# Patient Record
Sex: Female | Born: 1980 | Race: White | Hispanic: No | Marital: Married | Smoking: Former smoker
Health system: Southern US, Community
[De-identification: ages and names within clinical notes are randomized; demographics above are authoritative.]

## PROBLEM LIST (undated history)

## (undated) DIAGNOSIS — Z789 Other specified health status: Secondary | ICD-10-CM

## (undated) HISTORY — PX: NO PAST SURGERIES: SHX2092

---
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
## 2004-03-05 ENCOUNTER — Ambulatory Visit: Payer: Self-pay | Admitting: Pediatrics

## 2004-04-10 ENCOUNTER — Ambulatory Visit: Payer: Self-pay | Admitting: Pediatrics

## 2004-06-08 ENCOUNTER — Ambulatory Visit: Payer: Self-pay | Admitting: Pediatrics

## 2004-11-19 ENCOUNTER — Ambulatory Visit: Payer: Self-pay | Admitting: Pediatrics

## 2005-04-11 ENCOUNTER — Ambulatory Visit: Payer: Self-pay | Admitting: Pediatrics

## 2009-05-09 ENCOUNTER — Ambulatory Visit (HOSPITAL_BASED_OUTPATIENT_CLINIC_OR_DEPARTMENT_OTHER): Admission: RE | Admit: 2009-05-09 | Discharge: 2009-05-09 | Payer: Self-pay | Admitting: Orthopedic Surgery

## 2009-08-13 ENCOUNTER — Inpatient Hospital Stay (HOSPITAL_COMMUNITY): Admission: AD | Admit: 2009-08-13 | Discharge: 2009-08-13 | Payer: Self-pay | Admitting: Obstetrics & Gynecology

## 2010-06-26 LAB — WET PREP, GENITAL
Clue Cells Wet Prep HPF POC: NONE SEEN
Trich, Wet Prep: NONE SEEN
Yeast Wet Prep HPF POC: NONE SEEN

## 2010-06-26 LAB — GC/CHLAMYDIA PROBE AMP, GENITAL: Chlamydia, DNA Probe: NEGATIVE

## 2012-03-10 ENCOUNTER — Other Ambulatory Visit (HOSPITAL_COMMUNITY)
Admission: RE | Admit: 2012-03-10 | Discharge: 2012-03-10 | Disposition: A | Discharge status: Home / Self Care | Payer: 59 | Source: Ambulatory Visit | Attending: Family Medicine | Admitting: Family Medicine

## 2012-03-10 ENCOUNTER — Other Ambulatory Visit: Payer: Self-pay | Admitting: Family Medicine

## 2012-03-10 DIAGNOSIS — Z01419 Encounter for gynecological examination (general) (routine) without abnormal findings: Secondary | ICD-10-CM | POA: Insufficient documentation

## 2018-04-17 ENCOUNTER — Inpatient Hospital Stay (HOSPITAL_COMMUNITY)
Admission: AD | Admit: 2018-04-17 | Discharge: 2018-04-18 | Disposition: A | Discharge status: Home / Self Care | Payer: No Typology Code available for payment source | Source: Ambulatory Visit | Attending: Obstetrics & Gynecology | Admitting: Obstetrics & Gynecology

## 2018-04-17 ENCOUNTER — Encounter (HOSPITAL_COMMUNITY): Payer: Self-pay | Admitting: *Deleted

## 2018-04-17 DIAGNOSIS — O42912 Preterm premature rupture of membranes, unspecified as to length of time between rupture and onset of labor, second trimester: Secondary | ICD-10-CM | POA: Diagnosis not present

## 2018-04-17 DIAGNOSIS — Z3A15 15 weeks gestation of pregnancy: Secondary | ICD-10-CM | POA: Diagnosis not present

## 2018-04-17 DIAGNOSIS — O4290 Premature rupture of membranes, unspecified as to length of time between rupture and onset of labor, unspecified weeks of gestation: Secondary | ICD-10-CM

## 2018-04-17 HISTORY — DX: Other specified health status: Z78.9

## 2018-04-17 NOTE — MAU Note (Addendum)
Had gush fld about 2315 and soaked thru clothes. When we got to hospital had another gush of clear fld. Mild "gas-like pain " earlier. NO BM for couple day. No vag bleeding. Has appt Friday in [place], [place] with CNMs to begin [facility]. First two deliveries svd. Had u/s at [facility] in [place] about 9wks to give edc

## 2018-04-18 ENCOUNTER — Encounter: Payer: Self-pay | Admitting: Student

## 2018-04-18 ENCOUNTER — Inpatient Hospital Stay (HOSPITAL_BASED_OUTPATIENT_CLINIC_OR_DEPARTMENT_OTHER): Payer: No Typology Code available for payment source

## 2018-04-18 ENCOUNTER — Encounter (HOSPITAL_COMMUNITY): Payer: Self-pay

## 2018-04-18 DIAGNOSIS — Z3A15 15 weeks gestation of pregnancy: Secondary | ICD-10-CM | POA: Diagnosis not present

## 2018-04-18 DIAGNOSIS — O42912 Preterm premature rupture of membranes, unspecified as to length of time between rupture and onset of labor, second trimester: Secondary | ICD-10-CM

## 2018-04-18 DIAGNOSIS — O26899 Other specified pregnancy related conditions, unspecified trimester: Secondary | ICD-10-CM

## 2018-04-18 DIAGNOSIS — Z6791 Unspecified blood type, Rh negative: Secondary | ICD-10-CM | POA: Insufficient documentation

## 2018-04-18 DIAGNOSIS — O4292 Full-term premature rupture of membranes, unspecified as to length of time between rupture and onset of labor: Secondary | ICD-10-CM | POA: Diagnosis not present

## 2018-04-18 DIAGNOSIS — O09522 Supervision of elderly multigravida, second trimester: Secondary | ICD-10-CM | POA: Diagnosis not present

## 2018-04-18 DIAGNOSIS — O4290 Premature rupture of membranes, unspecified as to length of time between rupture and onset of labor, unspecified weeks of gestation: Secondary | ICD-10-CM | POA: Insufficient documentation

## 2018-04-18 LAB — URINALYSIS, ROUTINE W REFLEX MICROSCOPIC
BILIRUBIN URINE: NEGATIVE
Bacteria, UA: NONE SEEN
Glucose, UA: NEGATIVE mg/dL
Ketones, ur: NEGATIVE mg/dL
NITRITE: NEGATIVE
PH: 9 — AB (ref 5.0–8.0)
Protein, ur: NEGATIVE mg/dL
SPECIFIC GRAVITY, URINE: 1.008 (ref 1.005–1.030)

## 2018-04-18 LAB — POCT FERN TEST: POCT Fern Test: POSITIVE

## 2018-04-18 LAB — WET PREP, GENITAL
CLUE CELLS WET PREP: NONE SEEN
Sperm: NONE SEEN
Trich, Wet Prep: NONE SEEN
Yeast Wet Prep HPF POC: NONE SEEN

## 2018-04-18 MED ORDER — RHO D IMMUNE GLOBULIN 1500 UNIT/2ML IJ SOSY
300.0000 ug | PREFILLED_SYRINGE | INTRAMUSCULAR | Status: DC
  Filled 2018-04-18: qty 2

## 2018-04-18 NOTE — MAU Note (Signed)
Pt left before getting Rhogam.  Educated her and family about infection precautions related to PPROM and labor precautions. She indicated understanding of when to return to MAU.

## 2018-04-18 NOTE — MAU Provider Note (Addendum)
Patient  Lisa Fowler is a 38 y.o.  G3P2002 at 7993w2d by early US here with complaints of leaking of fluid at 2315 and midnight.  She also had some gas pains but no contractions. She denies complications in this pregnancy. She has had an uncomplicated pregnancy and two prior term NSVD out of hospital with midwives. She denies any health complaints, history of STIs or other ob-gyn problems.    She was planning to start prenatal care with a midwifery group in [place] this week. She does not have a regular ob-gyn in [place]; her other deliveries were with providers who are not local.    History     CSN: [id number]  Arrival date and time: 04/17/18 2333   None     Chief Complaint  Patient presents with  . Rupture of Membranes   Vaginal Discharge  The patient's primary symptoms include vaginal discharge. The patient's pertinent negatives include no genital itching. This is a new problem. The current episode started today. The problem occurs constantly. Associated symptoms include abdominal pain. Pertinent negatives include no dysuria, fever or urgency. The vaginal discharge was clear. There has been no bleeding. She has not been passing clots. She has not been passing tissue. Nothing aggravates the symptoms. She has tried nothing for the symptoms.    OB History    Gravida  3   Para  2   Term  2   Preterm      AB      Living  2     SAB      TAB      Ectopic      Multiple      Live Births              Past Medical History:  Diagnosis Date  . Medical history non-contributory     History reviewed. No pertinent surgical history.  No family history on file.  Social History   Tobacco Use  . Smoking status: Not on file  Substance Use Topics  . Alcohol use: Not on file  . Drug use: Not on file    Allergies: No Known Allergies  No medications prior to admission.    Review of Systems  Constitutional: Negative for fever.  Gastrointestinal: Positive  for abdominal pain.  Genitourinary: Positive for vaginal discharge. Negative for dysuria and urgency.  Neurological: Negative.   Psychiatric/Behavioral: Negative.    Physical Exam   Blood pressure (!) 123/56, pulse (!) 108, temperature 98.1 F (36.7 C), resp. rate 20, height 5\' 3"  (1.6 m), weight 69.9 kg.  Physical Exam  Constitutional: She is oriented to person, place, and time. She appears well-developed and well-nourished.  HENT:  Head: Normocephalic.  Neck: Normal range of motion.  Respiratory: Effort normal.  GI: Soft.  Genitourinary:    Genitourinary Comments: NEFG; on exam there is positive pooling and continous leaking of amniotic fluid mixed with blood. Cervix appears visually closed; digital exam not done.    Musculoskeletal: Normal range of motion.  Neurological: She is alert and oriented to person, place, and time.  Skin: Skin is warm and dry.    MAU Course  Procedures  MDM -patient is grossly ruptured; bedside US shows present cardiac activity and oligohydramnios. GA is 15 weeks 2 days.   -patient is O negative but declines Rhogam at this time.  -Discussed case with Dr. Despina HiddenEure, who recommends that patient be given the option to go home and return when labor starts or if  she develops concerning symptoms or she may be admitted and start cytotec induction.     Assessment and Plan   1. Premature rupture of membranes in second trimester, unspecified duration to onset of labor   2. [redacted] weeks gestation of pregnancy   3. Amniotic fluid leaking    2. Patient elects to go home and rest. She agrees to return to hospital if she develops fever, abdominal pain or tenderness, foul-smelling discharge, bleeding or other concerning symptoms.   3. Reviewed with patient that if she were to suddenly deliver at home she should call 9-11 and that she she must come in for Rhogam if she delivers at home.   4. Counseled her on importance of Rhogam; patient wants to get Rhogam but  declines right now and declines all lab work. She is very tearful and upset; wants to leave with her aunt and husband.    Lisa GaribaldiKathryn Fowler  04/18/2018, 3:43 AM

## 2018-04-18 NOTE — Discharge Instructions (Signed)
-  check temp twice a day; come to MAU if temp is greater than 100.4 -if discharge becomes foul smelling, dark brown/green, come back to MAU -if bleeding or cramping worsen, come to MAU.  -YOU NEED RHOGAM--must return to MAU for Rhogam even if deliver at home.

## 2018-04-20 LAB — GC/CHLAMYDIA PROBE AMP (~~LOC~~) NOT AT ARMC
Chlamydia: NEGATIVE
NEISSERIA GONORRHEA: NEGATIVE

## 2018-04-25 ENCOUNTER — Inpatient Hospital Stay (HOSPITAL_COMMUNITY)
Admission: AD | Admit: 2018-04-25 | Discharge: 2018-04-26 | Disposition: A | Discharge status: Home / Self Care | Payer: Commercial Managed Care - PPO | Source: Ambulatory Visit | Attending: Obstetrics and Gynecology | Admitting: Obstetrics and Gynecology

## 2018-04-25 ENCOUNTER — Inpatient Hospital Stay (HOSPITAL_COMMUNITY): Payer: Commercial Managed Care - PPO

## 2018-04-25 ENCOUNTER — Encounter (HOSPITAL_COMMUNITY): Payer: Self-pay | Admitting: *Deleted

## 2018-04-25 DIAGNOSIS — Z87891 Personal history of nicotine dependence: Secondary | ICD-10-CM | POA: Insufficient documentation

## 2018-04-25 DIAGNOSIS — O26892 Other specified pregnancy related conditions, second trimester: Secondary | ICD-10-CM

## 2018-04-25 DIAGNOSIS — O039 Complete or unspecified spontaneous abortion without complication: Secondary | ICD-10-CM | POA: Insufficient documentation

## 2018-04-25 DIAGNOSIS — Z6791 Unspecified blood type, Rh negative: Secondary | ICD-10-CM | POA: Insufficient documentation

## 2018-04-25 LAB — CBC
HEMATOCRIT: 33.5 % — AB (ref 36.0–46.0)
HEMOGLOBIN: 11.1 g/dL — AB (ref 12.0–15.0)
MCH: 28.5 pg (ref 26.0–34.0)
MCHC: 33.1 g/dL (ref 30.0–36.0)
MCV: 86.1 fL (ref 80.0–100.0)
NRBC: 0 % (ref 0.0–0.2)
PLATELETS: 248 10*3/uL (ref 150–400)
RBC: 3.89 MIL/uL (ref 3.87–5.11)
RDW: 15.7 % — ABNORMAL HIGH (ref 11.5–15.5)
WBC: 16.8 10*3/uL — ABNORMAL HIGH (ref 4.0–10.5)

## 2018-04-25 LAB — TYPE AND SCREEN
ABO/RH(D): O NEG
Antibody Screen: NEGATIVE

## 2018-04-25 MED ORDER — HYDROXYZINE HCL 50 MG PO TABS
50.0000 mg | ORAL_TABLET | Freq: Once | ORAL | Status: AC
  Administered 2018-04-25: 50 mg via ORAL
  Filled 2018-04-25: qty 1

## 2018-04-25 MED ORDER — KETOROLAC TROMETHAMINE 10 MG PO TABS
10.0000 mg | ORAL_TABLET | Freq: Once | ORAL | Status: AC | PRN
  Administered 2018-04-25: 10 mg via ORAL
  Filled 2018-04-25: qty 1

## 2018-04-25 MED ORDER — RHO D IMMUNE GLOBULIN 1500 UNIT/2ML IJ SOSY
300.0000 ug | PREFILLED_SYRINGE | Freq: Once | INTRAMUSCULAR | Status: AC
  Administered 2018-04-25: 300 ug via INTRAMUSCULAR
  Filled 2018-04-25: qty 2

## 2018-04-25 MED ORDER — MISOPROSTOL 200 MCG PO TABS
800.0000 ug | ORAL_TABLET | Freq: Once | ORAL | Status: AC
  Administered 2018-04-25: 800 ug via BUCCAL
  Filled 2018-04-25: qty 4

## 2018-04-25 NOTE — MAU Note (Signed)
Urine in lab 

## 2018-04-25 NOTE — MAU Provider Note (Addendum)
Chief Complaint: Miscarriage   First Provider Initiated Contact with Patient 04/25/18 2018     SUBJECTIVE HPI: Lisa Fowler is a 38 y.o. [id number] at [redacted]w[redacted]d who presents to Maternity Admissions reporting miscarriage at 3:30 AM after PPPROM on 04/18/2018.  Had small amount of bleeding with delivery of fetus, but does not think she passed the placenta.  Having scant bleeding now.  Denies pain.  Is extremely anxious.  Planned on getting prenatal care with midwife in [place], but was scheduled for [facility] visit [facility] on 04/29/2018 after her water broke last week.  Associated signs and symptoms: Negative for fever, chills, abdominal pain or heavy bleeding.  Husband showed picture of fetus to CNM.  Stated that was all the passed.  No placenta seen.  Past Medical History:  Diagnosis Date  . Medical history non-contributory    OB History  Gravida Para Term Preterm AB Living  3 2 2   1 2   SAB TAB Ectopic Multiple Live Births  1            # Outcome Date GA Lbr Len/2nd Weight Sex Delivery Anes PTL Lv  3 Term 03/03/10    M Vag-Spont     2 Term 03/30/08    F Vag-Spont     1 SAB         FD   Past Surgical History:  Procedure Laterality Date  . NO PAST SURGERIES     Social History   Socioeconomic History  . Marital status: Married    Spouse name: Not on file  . Number of children: Not on file  . Years of education: Not on file  . Highest education level: Not on file  Occupational History  . Not on file  Social Needs  . Financial resource strain: Not on file  . Food insecurity:    Worry: Not on file    Inability: Not on file  . Transportation needs:    Medical: Not on file    Non-medical: Not on file  Tobacco Use  . Smoking status: Former Games developer  . Smokeless tobacco: Never Used  Substance and Sexual Activity  . Alcohol use: Never    Frequency: Never  . Drug use: Never  . Sexual activity: Not on file  Lifestyle  . Physical  activity:    Days per week: Not on file    Minutes per session: Not on file  . Stress: Not on file  Relationships  . Social connections:    Talks on phone: Not on file    Gets together: Not on file    Attends religious service: Not on file    Active member of club or organization: Not on file    Attends meetings of clubs or organizations: Not on file    Relationship status: Not on file  . Intimate partner violence:    Fear of current or ex partner: Not on file    Emotionally abused: Not on file    Physically abused: Not on file    Forced sexual activity: Not on file  Other Topics Concern  . Not on file  Social History Narrative  . Not on file   History reviewed. No pertinent family history. No current facility-administered medications on file prior to encounter.    Current Outpatient Medications on File Prior to Encounter  Medication Sig Dispense Refill  . acetaminophen (TYLENOL) 500 MG tablet Take 500 mg by mouth every 6 (six) hours  as needed.    . Ascorbic Acid (VITAMIN C) 100 MG tablet Take 100 mg by mouth daily.    Marland Kitchen. ibuprofen (ADVIL,MOTRIN) 400 MG tablet Take 400 mg by mouth every 6 (six) hours as needed.    . Prenatal Vit-Fe Fumarate-FA (MULTIVITAMIN-PRENATAL) 27-0.8 MG TABS tablet Take 1 tablet by mouth daily at 12 noon.     No Known Allergies  I have reviewed patient's Past Medical Hx, Surgical Hx, Family Hx, Social Hx, medications and allergies.   Review of Systems  Constitutional: Negative for chills and fever.  Gastrointestinal: Negative for abdominal pain.  Genitourinary: Positive for vaginal bleeding (Scant). Negative for vaginal discharge.  Neurological: Negative for dizziness.  Psychiatric/Behavioral: The patient is nervous/anxious.     OBJECTIVE Patient Vitals for the past 24 hrs:  BP Temp Pulse Resp Height  04/25/18 2006 133/69 98.1 F (36.7 C) (!) 111 20 5\' 3"  (1.6 m)   Constitutional: Well-developed, well-nourished female in no acute distress.   Anxious. Cardiovascular: Mild tachycardia Respiratory: normal rate and effort.  GI: Abd soft, non-tender, fundus non-palpable. MS: Extremities nontender, no edema, normal ROM Neurologic: Alert and oriented x 4.  GU:  SPECULUM EXAM: NEFG, physiologic discharge, and small amount of brown blood noted, cervix visibly closed, normal ectropion.  No tissue protruding through the office.  BIMANUAL: cervix 0.5 centimeters; uterus 8-week size, no adnexal tenderness or masses.  No CMT.  LAB RESULTS Results for orders placed or performed during the hospital encounter of 04/25/18 (from the past 24 hour(s))  CBC     Status: Abnormal   Collection Time: 04/25/18  8:59 PM  Result Value Ref Range   WBC 16.8 (H) 4.0 - 10.5 K/uL   RBC 3.89 3.87 - 5.11 MIL/uL   Hemoglobin 11.1 (L) 12.0 - 15.0 g/dL   HCT 16.133.5 (L) 09.636.0 - 04.546.0 %   MCV 86.1 80.0 - 100.0 fL   MCH 28.5 26.0 - 34.0 pg   MCHC 33.1 30.0 - 36.0 g/dL   RDW 40.915.7 (H) 81.111.5 - 91.415.5 %   Platelets 248 150 - 400 K/uL   nRBC 0.0 0.0 - 0.2 %  Rh IG workup (includes ABO/Rh)     Status: None (Preliminary result)   Collection Time: 04/25/18  8:59 PM  Result Value Ref Range   Gestational Age(Wks) 16    ABO/RH(D) O NEG    Antibody Screen NEG    Unit Number [id number]    Blood Component Type RHIG    Unit division 00    Status of Unit ISSUED    Transfusion Status OK TO TRANSFUSE   Type and screen     Status: None   Collection Time: 04/25/18  8:59 PM  Result Value Ref Range   ABO/RH(D) O NEG    Antibody Screen NEG    Sample Expiration      04/28/2018 Performed at [facility], [address] [place], [place] [address]     IMAGING [place] Pelvis Limited (transabdominal Only)  Result Date: 04/25/2018 CLINICAL DATA:  Spontaneous abortion in second trimester. Rule out retained products of conception. EXAM: LIMITED ULTRASOUND OF PELVIS TECHNIQUE: Limited transabdominal ultrasound examination of the pelvis was performed. COMPARISON:  04/18/2018  FINDINGS: Uterus measures 15.3 x 7.3 x 11.6 cm. Retained products of conception identified, including hypervascular soft tissue echogenicity at on the order of 8.9 x 3.8 by 7.7 cm. No significant free fluid. IMPRESSION: Retained products of conception, as detailed above. Electronically Signed   By: Hosie SpangleKyle  Talbot M.D.  On: 04/25/2018 22:16     MAU COURSE Orders Placed This Encounter  Procedures  . US OB Limited  . CBC  . Diet NPO time specified  . Rh IG workup (includes ABO/Rh)  . Type and screen   Meds ordered this encounter  Medications  . hydrOXYzine (ATARAX/VISTARIL) tablet 50 mg  . ketorolac (TORADOL) tablet 10 mg   Care of patient turned over to Judeth Horn, NP at 9:45 PM.  Patient and ultrasound.  Dr. Jolayne Panther notified of patient history, exam, labs.   Katrinka Blazing, [place], CNM 04/25/2018  9:51 PM   Pt given rhogam in MAU 2 hours s/p cytotec administration, pt has passed some clots, placenta not delivered at this point.  D/w Dr. Jolayne Panther. Can continue cytotec Q3 hrs until placenta delivers. Will send pt to [facility] unit for management per patient preference & staff request.   Prior to patient being taking to [facility] unit, she passed placenta. Sent to pathology. Patient discharged home in stable condition. Will cancel new ob appt & sent msg for SAB f/u.   A:  1. Miscarriage   2. Spontaneous abortion in second trimester   3. Rh negative status during pregnancy in second trimester    P: Discharge in stable condition Discussed reasons to return to MAU  Judeth Horn, NP

## 2018-04-25 NOTE — MAU Note (Signed)
Pt's spouse came out stating she had passed placenta. Judeth HornErin Lawrence NP in to see pt. Pt had passed about 3cm clot. Pericare given and pad changed. When pt lifted up passed another 3cm clot. Pad replaced. Pt stated is comfortable. NP let pt know would observe her another hour and see if placenta passes.

## 2018-04-25 NOTE — MAU Note (Signed)
Was in MAU last wk with PPROM at approx 16wks. Opted to go home and wait. States delivered baby about 0300 today. Bled some but not much. Unsure if has delivered placenta. States is not bleeding now and no pain. Was just anxious about if everything is ok. Does state stomach feels alittle funny but thinks may just be nerves

## 2018-04-26 DIAGNOSIS — O039 Complete or unspecified spontaneous abortion without complication: Secondary | ICD-10-CM

## 2018-04-26 LAB — RH IG WORKUP (INCLUDES ABO/RH)
ABO/RH(D): O NEG
ANTIBODY SCREEN: NEGATIVE
Gestational Age(Wks): 16
UNIT DIVISION: 0

## 2018-04-26 NOTE — Progress Notes (Signed)
Written and verbal d/c instructions given and understanding voiced. Pt has appt Weds with her provider and will f/u there. Understands to return to MAU sooner for any concerns.

## 2018-04-26 NOTE — H&P (Deleted)
SUBJECTIVE HPI: Lisa Fowler is a 38 y.o. [id number] at [redacted]w[redacted]d who is being observed for management of retained placenta. Patient delivered 16 week fetus at home earlier today after being diagnosed with PPROM last week. Per patient's description & confirmed by ultrasound, placenta has not delivered.  Patient given cytotec while in MAU which has resulted in passage of some blood clots but no placenta.  Pt is rh negative & was given rhogam in MAU.   Past Medical History:  Diagnosis Date  . Medical history non-contributory    OB History  Gravida Para Term Preterm AB Living  3 2 2   1 2   SAB TAB Ectopic Multiple Live Births  1            # Outcome Date GA Lbr Len/2nd Weight Sex Delivery Anes PTL Lv  3 Term 03/03/10    M Vag-Spont     2 Term 03/30/08    F Vag-Spont     1 SAB         FD   Past Surgical History:  Procedure Laterality Date  . NO PAST SURGERIES     Social History   Socioeconomic History  . Marital status: Married    Spouse name: Not on file  . Number of children: Not on file  . Years of education: Not on file  . Highest education level: Not on file  Occupational History  . Not on file  Social Needs  . Financial resource strain: Not on file  . Food insecurity:    Worry: Not on file    Inability: Not on file  . Transportation needs:    Medical: Not on file    Non-medical: Not on file  Tobacco Use  . Smoking status: Former Games developer  . Smokeless tobacco: Never Used  Substance and Sexual Activity  . Alcohol use: Never    Frequency: Never  . Drug use: Never  . Sexual activity: Not on file  Lifestyle  . Physical activity:    Days per week: Not on file    Minutes per session: Not on file  . Stress: Not on file  Relationships  . Social connections:    Talks on phone: Not on file    Gets together: Not on file    Attends religious service: Not on file    Active member of club or organization: Not on file    Attends meetings of clubs or organizations: Not  on file    Relationship status: Not on file  . Intimate partner violence:    Fear of current or ex partner: Not on file    Emotionally abused: Not on file    Physically abused: Not on file    Forced sexual activity: Not on file  Other Topics Concern  . Not on file  Social History Narrative  . Not on file   History reviewed. No pertinent family history. No current facility-administered medications on file prior to encounter.    Current Outpatient Medications on File Prior to Encounter  Medication Sig Dispense Refill  . acetaminophen (TYLENOL) 500 MG tablet Take 500 mg by mouth every 6 (six) hours as needed.    . Ascorbic Acid (VITAMIN C) 100 MG tablet Take 100 mg by mouth daily.    Marland Kitchen ibuprofen (ADVIL,MOTRIN) 400 MG tablet Take 400 mg by mouth every 6 (six) hours as needed.    . Prenatal Vit-Fe Fumarate-FA (MULTIVITAMIN-PRENATAL) 27-0.8 MG TABS tablet Take 1 tablet by mouth daily at 12  noon.     No Known Allergies  I have reviewed patient's Past Medical Hx, Surgical Hx, Family Hx, Social Hx, medications and allergies.   Review of Systems  Constitutional: Negative for chills and fever.  Gastrointestinal: Positive for abdominal pain.  Genitourinary: Positive for vaginal bleeding.    OBJECTIVE Patient Vitals for the past 24 hrs:  BP Temp Pulse Resp Height  04/25/18 2006 133/69 98.1 F (36.7 C) (!) 111 20 5\' 3"  (1.6 m)   Constitutional: Well-developed, well-nourished female in no acute distress.  Cardiovascular: normal rate & rhythm, no murmur Respiratory: normal rate and effort. Lung sounds clear throughout GI: Abd soft, non-tender, Pos BS x 4. No guarding or rebound tenderness MS: Extremities nontender, no edema, normal ROM Neurologic: Alert and oriented x 4.    LAB RESULTS Results for orders placed or performed during the hospital encounter of 04/25/18 (from the past 24 hour(s))  CBC     Status: Abnormal   Collection Time: 04/25/18  8:59 PM  Result Value Ref Range   WBC  16.8 (H) 4.0 - 10.5 K/uL   RBC 3.89 3.87 - 5.11 MIL/uL   Hemoglobin 11.1 (L) 12.0 - 15.0 g/dL   HCT 16.133.5 (L) 09.636.0 - 04.546.0 %   MCV 86.1 80.0 - 100.0 fL   MCH 28.5 26.0 - 34.0 pg   MCHC 33.1 30.0 - 36.0 g/dL   RDW 40.915.7 (H) 81.111.5 - 91.415.5 %   Platelets 248 150 - 400 K/uL   nRBC 0.0 0.0 - 0.2 %  Rh IG workup (includes ABO/Rh)     Status: None (Preliminary result)   Collection Time: 04/25/18  8:59 PM  Result Value Ref Range   Gestational Age(Wks) 16    ABO/RH(D) O NEG    Antibody Screen NEG    Unit Number [id number]    Blood Component Type RHIG    Unit division 00    Status of Unit ISSUED    Transfusion Status OK TO TRANSFUSE   Type and screen     Status: None   Collection Time: 04/25/18  8:59 PM  Result Value Ref Range   ABO/RH(D) O NEG    Antibody Screen NEG    Sample Expiration      04/28/2018 Performed at [facility], [address] [place], [place] [address]     IMAGING [place] Pelvis Limited (transabdominal Only)  Result Date: 04/25/2018 CLINICAL DATA:  Spontaneous abortion in second trimester. Rule out retained products of conception. EXAM: LIMITED ULTRASOUND OF PELVIS TECHNIQUE: Limited transabdominal ultrasound examination of the pelvis was performed. COMPARISON:  04/18/2018 FINDINGS: Uterus measures 15.3 x 7.3 x 11.6 cm. Retained products of conception identified, including hypervascular soft tissue echogenicity at on the order of 8.9 x 3.8 by 7.7 cm. No significant free fluid. IMPRESSION: Retained products of conception, as detailed above. Electronically Signed   By: Jeronimo GreavesKyle  Talbot M.D.   On: 04/25/2018 22:16   Assessment & Plan Retained placenta -continued cytotec every 3 hours -pain meds ordered prn -pt NPO   Judeth HornLawrence, Uzziah Rigg, NP 04/26/2018  1:34 AM

## 2018-04-26 NOTE — Discharge Instructions (Signed)
Coping with Pregnancy Loss °Pregnancy loss can happen any time during a pregnancy. Often the cause is not known. It is rarely because of anything you did. Pregnancy loss in early pregnancy (during the first trimester) is called a miscarriage. This type of pregnancy loss is the most common. Pregnancy loss that happens after 20 weeks of pregnancy is called fetal demise if the baby's heart stops beating before birth. Fetal demise is much less common. Some women experience spontaneous labor shortly after fetal demise resulting in a stillborn birth (stillbirth). °Any pregnancy loss can be devastating. You will need to recover both physically and emotionally. Most women are able to get pregnant again after a pregnancy loss and deliver a healthy baby. °How to manage emotional recovery ° °Pregnancy loss is very hard emotionally. You may feel many different emotions while you grieve. You may feel sad and angry. You may also feel guilty. It is normal to have periods of crying. Emotional recovery can take longer than physical recovery. It is different for everyone. °Taking these steps can help you cope: °· Remember that it is unlikely you did anything to cause the pregnancy loss. °· Share your thoughts and feelings with friends, family, and your partner. Remember that your partner is also recovering emotionally. °· Make sure you have a good support system, and do not spend too much time alone. °· Meet with a pregnancy loss counselor or join a pregnancy loss support group. °· Get enough sleep and eat a healthy diet. Return to regular exercise when you have recovered physically. °· Do not use drugs or alcohol to manage your emotions. °· Consider seeing a mental health professional to help you recover emotionally. °· Ask a friend or loved one to help you decide what to do with any clothing and nursery items you received for your baby. °In the case of a stillbirth, many women benefit from taking additional steps in the grieving  process. You may want to: °· Hold your baby after the birth. °· Name your baby. °· Request a birth certificate. °· Create a keepsake such as handprints or footprints. °· Dress your baby and have a picture taken. °· Make funeral arrangements. °· Ask for a baptism or blessing. °Hospitals have staff members who can help you with all these arrangements. °How to recognize emotional stress °It is normal to have emotional stress after a pregnancy loss. But emotional stress that lasts a long time or becomes severe requires treatment. Watch out for these signs of severe emotional stress: °· Sadness, anger, or guilt that is not going away and is interfering with your normal activities. °· Relationship problems that have occurred or gotten worse since the pregnancy loss. °· Signs of depression that last longer than 2 weeks. These may include: °? Sadness. °? Anxiety. °? Hopelessness. °? Loss of interest in activities you enjoy. °? Inability to concentrate. °? Trouble sleeping or sleeping too much. °? Loss of appetite or overeating. °? Thoughts of death or of hurting yourself. °Follow these instructions at home: °Medicines °· Take over-the-counter and prescription medicines only as told by your health care provider. °Activity °· Rest at home until your energy level returns. Return to your normal activities as told by your health care provider. Ask your health care provider what activities are safe for you. °General instructions °· Keep all follow-up visits as told by your health care provider. This is important. °· It may be helpful to meet with others who have experienced pregnancy loss. Ask your health   care provider about support groups and resources. °· To help you and your partner with the process of grieving, talk with your health care provider or seek counseling. °· When you are ready, meet with your health care provider to discuss steps to take for a future pregnancy. °Where to find more information °· [place] Department of  Health and Human Services Office on Women's Health: www.womenshealth.gov °· American Pregnancy Association: www.americanpregnancy.org °Contact a health care provider if: °· You continue to experience grief, sadness, or lack of motivation for everyday activities, and those feelings do not improve over time. °· You are struggling to recover emotionally, especially if you are using alcohol or substances to help. °Get help right away if: °· You have thoughts of hurting yourself or others. °If you ever feel like you may hurt yourself or others, or have thoughts about taking your own life, get help right away. You can go to your nearest emergency department or call: °· Your local emergency services (911 in the [place] °· A suicide crisis helpline, such as the National Suicide Prevention Lifeline at 1-800-273-8255. This is open 24 hours a day. °Summary °· Any pregnancy loss can be difficult physically and emotionally. °· You may experience many different emotions while you grieve. Emotional recovery can last longer than physical recovery. °· It is normal to have emotional stress after a pregnancy loss. But emotional stress that lasts a long time or becomes severe requires treatment. °· See your health care provider if you are struggling emotionally after a pregnancy loss. °This information is not intended to replace advice given to you by your health care provider. Make sure you discuss any questions you have with your health care provider. °Document Released: 06/05/2017 Document Revised: 06/05/2017 Document Reviewed: 06/05/2017 °[employer]vier Interactive Patient Education © 2019 [employer] ° ° °Miscarriage °A miscarriage is the loss of an unborn baby (fetus) before the 20th week of pregnancy. Most miscarriages happen during the first 3 months of pregnancy. Sometimes, a miscarriage can happen before a woman knows that she is pregnant. °Having a miscarriage can be an emotional experience. If you have had a miscarriage, talk  with your health care provider about any questions you may have about miscarrying, the grieving process, and your plans for future pregnancy. °What are the causes? °A miscarriage may be caused by: °· Problems with the genes or chromosomes of the fetus. These problems make it impossible for the baby to develop normally. They are often the result of random errors that occur early in the development of the baby, and are not passed from parent to child (not inherited). °· Infection of the cervix or uterus. °· Conditions that affect hormone balance in the body. °· Problems with the cervix, such as the cervix opening and thinning before pregnancy is at term (cervical insufficiency). °· Problems with the uterus. These may include: °? A uterus with an abnormal shape. °? Fibroids in the uterus. °? Congenital abnormalities. These are problems that were present at birth. °· Certain medical conditions. °· Smoking, drinking alcohol, or using drugs. °· Injury (trauma). °In many cases, the cause of a miscarriage is not known. °What are the signs or symptoms? °Symptoms of this condition include: °· Vaginal bleeding or spotting, with or without cramps or pain. °· Pain or cramping in the abdomen or lower back. °· Passing fluid, tissue, or blood clots from the vagina. °How is this diagnosed? °This condition may be diagnosed based on: °· A physical exam. °· Ultrasound. °· Blood tests. °·   Urine tests. °How is this treated? °Treatment for a miscarriage is sometimes not necessary if you naturally pass all the tissue that was in your uterus. If necessary, this condition may be treated with: °· Dilation and curettage (D&C). This is a procedure in which the cervix is stretched open and the lining of the uterus (endometrium) is scraped. This is done only if tissue from the fetus or placenta remains in the body (incomplete miscarriage). °· Medicines, such as: °? Antibiotic medicine, to treat infection. °? Medicine to help the body pass any  remaining tissue. °? Medicine to reduce (contract) the size of the uterus. These medicines may be given if you have a lot of bleeding. °If you have Rh negative blood and your baby was Rh positive, you will need a shot of a medicine called Rh immunoglobulinto protect your future babies from Rh blood problems. "Rh-negative" and "Rh-positive" refer to whether or not the blood has a specific protein found on the surface of red blood cells (Rh factor). °Follow these instructions at home: °Medicines ° °· Take over-the-counter and prescription medicines only as told by your health care provider. °· If you were prescribed antibiotic medicine, take it as told by your health care provider. Do not stop taking the antibiotic even if you start to feel better. °· Do not take NSAIDs, such as aspirin and ibuprofen, unless they are approved by your health care provider. These medicines can cause bleeding. °Activity °· Rest as directed. Ask your health care provider what activities are safe for you. °· Have someone help with home and family responsibilities during this time. °General instructions °· Keep track of the number of sanitary pads you use each day and how soaked (saturated) they are. Write down this information. °· Monitor the amount of tissue or blood clots that you pass from your vagina. Save any large amounts of tissue for your health care provider to examine. °· Do not use tampons, douche, or have sex until your health care provider approves. °· To help you and your partner with the process of grieving, talk with your health care provider or seek counseling. °· When you are ready, meet with your health care provider to discuss any important steps you should take for your health. Also, discuss steps you should take to have a healthy pregnancy in the future. °· Keep all follow-up visits as told by your health care provider. This is important. °Where to find more information °· The American Congress of Obstetricians and  Gynecologists: www.acog.org °· [place] Department of Health and Human Services Office of Women’s Health: www.womenshealth.gov °Contact a health care provider if: °· You have a fever or chills. °· You have a foul smelling vaginal discharge. °· You have more bleeding instead of less. °Get help right away if: °· You have severe cramps or pain in your back or abdomen. °· You pass blood clots or tissue from your vagina that is walnut-sized or larger. °· You soak more than 1 regular sanitary pad in an hour. °· You become light-headed or weak. °· You pass out. °· You have feelings of sadness that take over your thoughts, or you have thoughts of hurting yourself. °Summary °· Most miscarriages happen in the first 3 months of pregnancy. Sometimes miscarriage happens before a woman even knows that she is pregnant. °· Follow your health care provider's instruction for home care. Keep all follow-up appointments. °· To help you and your partner with the process of grieving, talk with your health care   provider or seek counseling. °This information is not intended to replace advice given to you by your health care provider. Make sure you discuss any questions you have with your health care provider. °Document Released: 09/18/2000 Document Revised: 04/30/2016 Document Reviewed: 04/30/2016 °Elsevier Interactive Patient Education © 2019 [employer] ° °

## 2018-04-26 NOTE — MAU Note (Signed)
Pt passed placenta but thought was blood clot and did not call anyone.

## 2018-04-29 ENCOUNTER — Encounter: Payer: No Typology Code available for payment source | Admitting: Obstetrics & Gynecology

## 2018-05-15 ENCOUNTER — Ambulatory Visit: Payer: Self-pay | Admitting: Advanced Practice Midwife

## 2020-01-13 IMAGING — US US PELVIS LIMITED
1 series · 11 of 11 positions shown · non-contrast
Comparison: 04/18/2018

CLINICAL DATA: Spontaneous abortion in second trimester. Rule out
retained products of conception.

EXAM:
LIMITED ULTRASOUND OF PELVIS
TECHNIQUE: Limited transabdominal ultrasound examination of the pelvis was
performed.

[Series 1: us pelvis limited · 11 acquisitions, 11 frames shown]
[im 1/11]
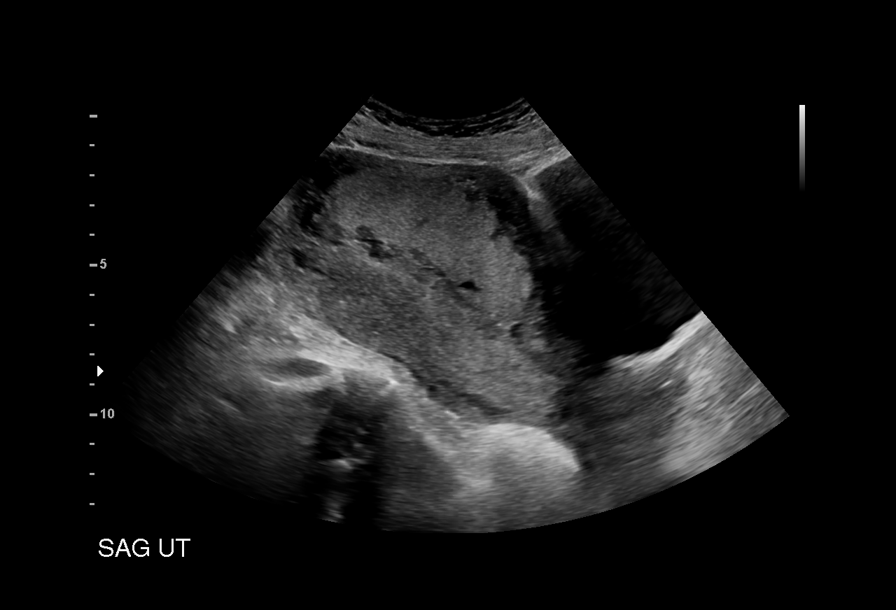
[im 2/11]
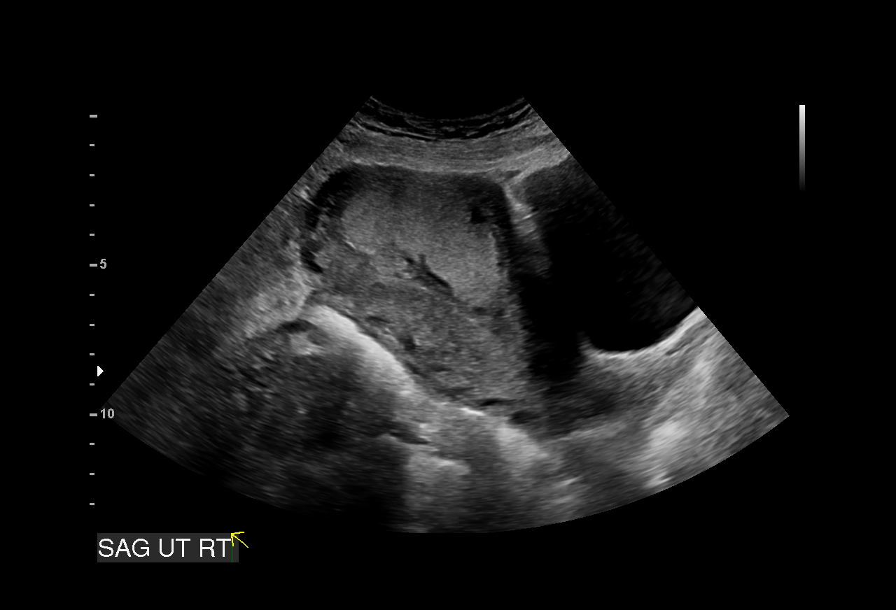
[im 3/11]
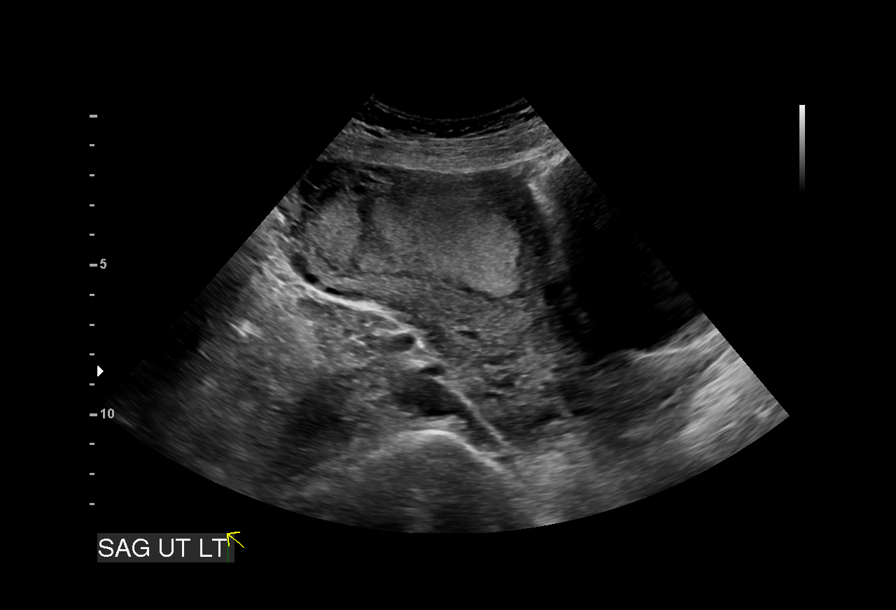
[im 4/11]
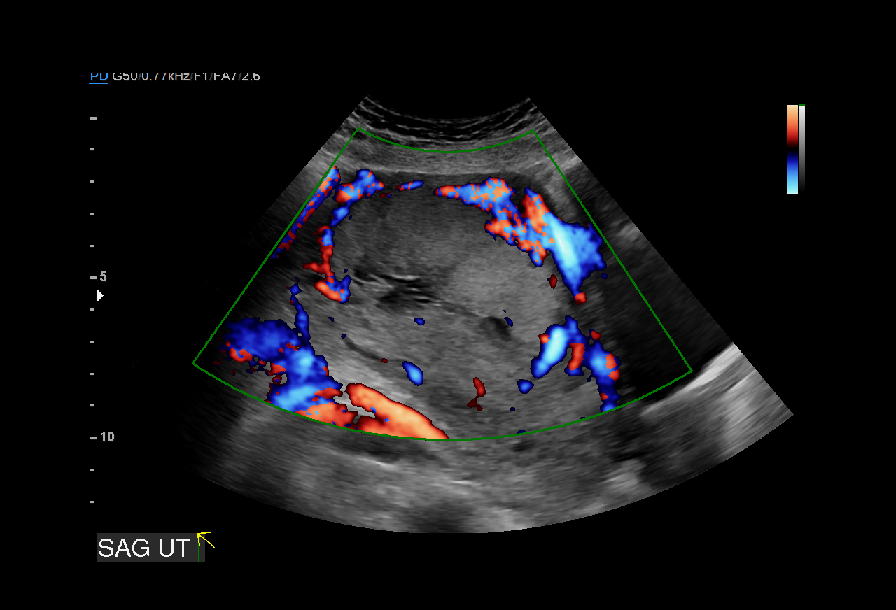
[im 5/11]
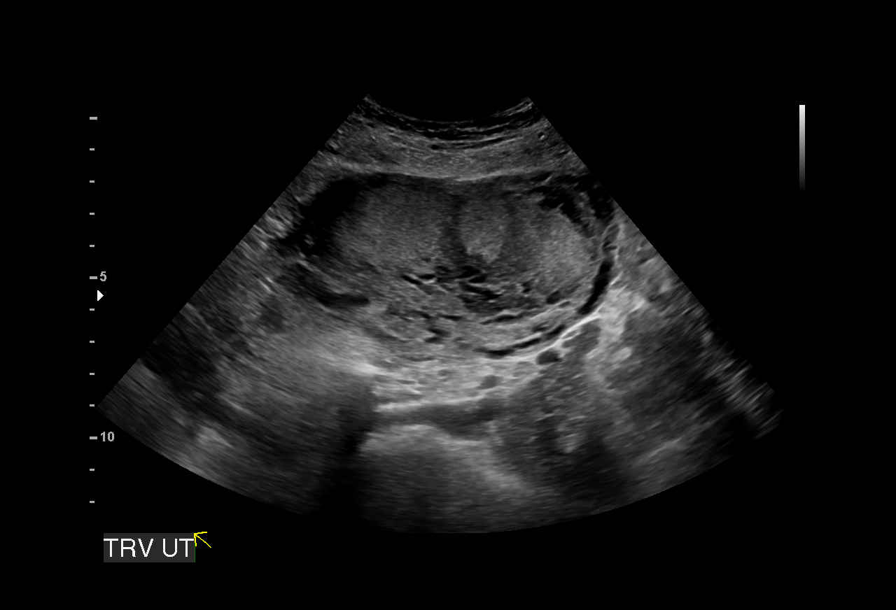
[im 6/11]
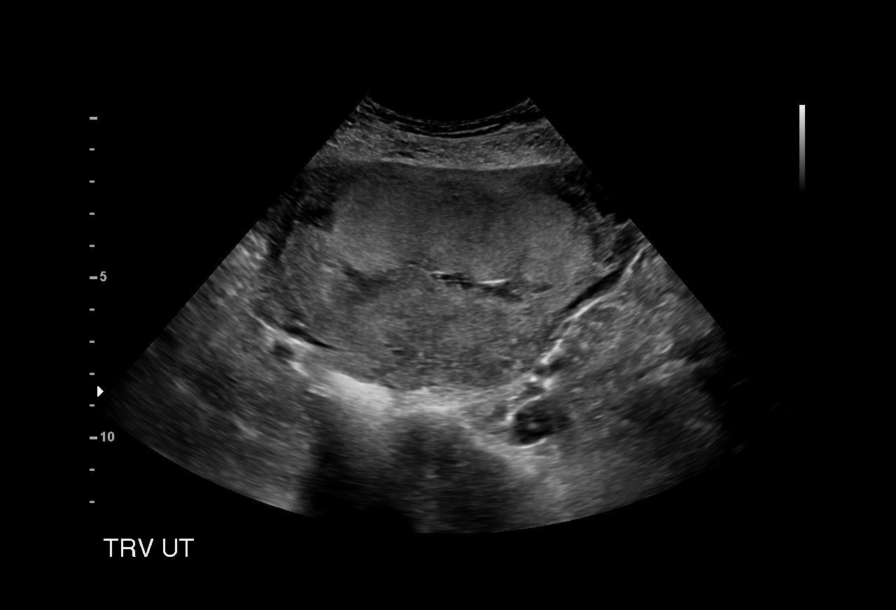
[im 7/11]
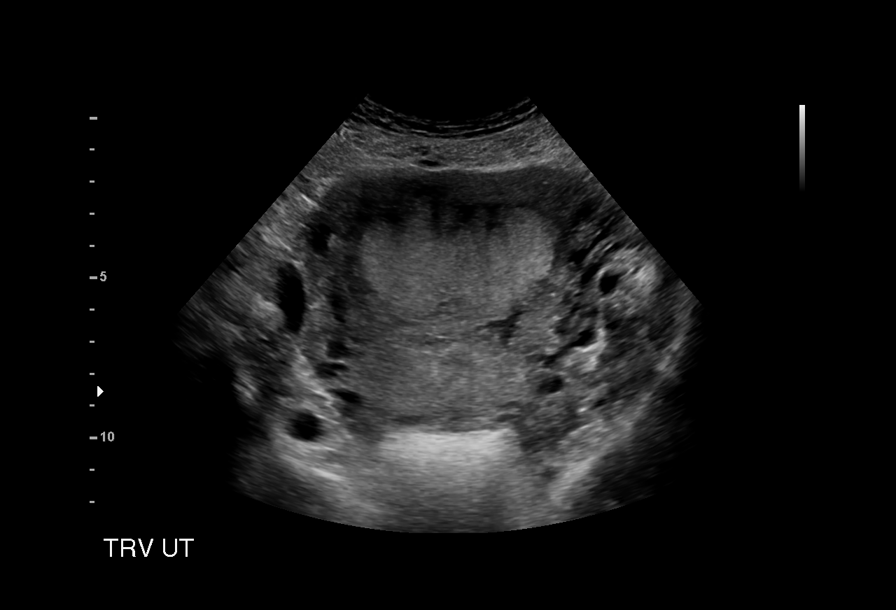
[im 8/11]
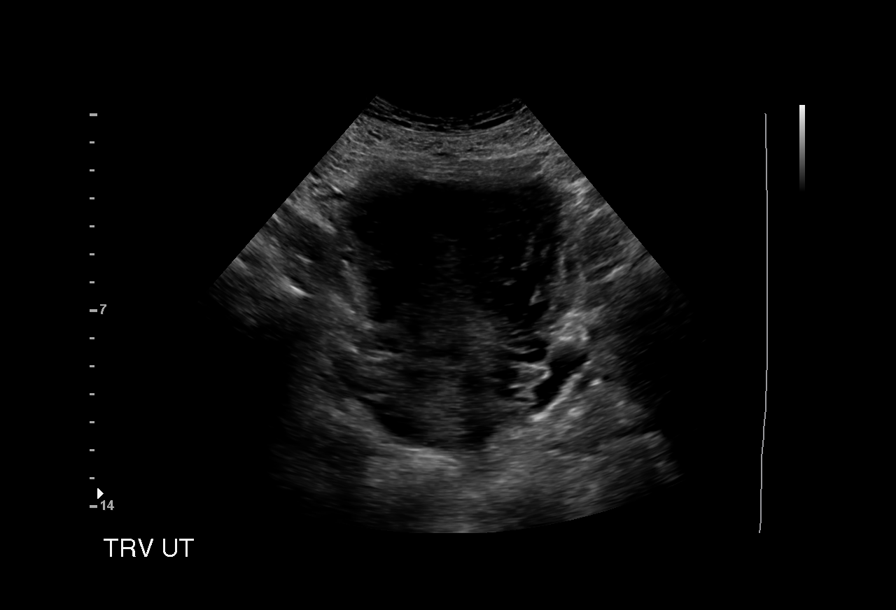
[im 9/11]
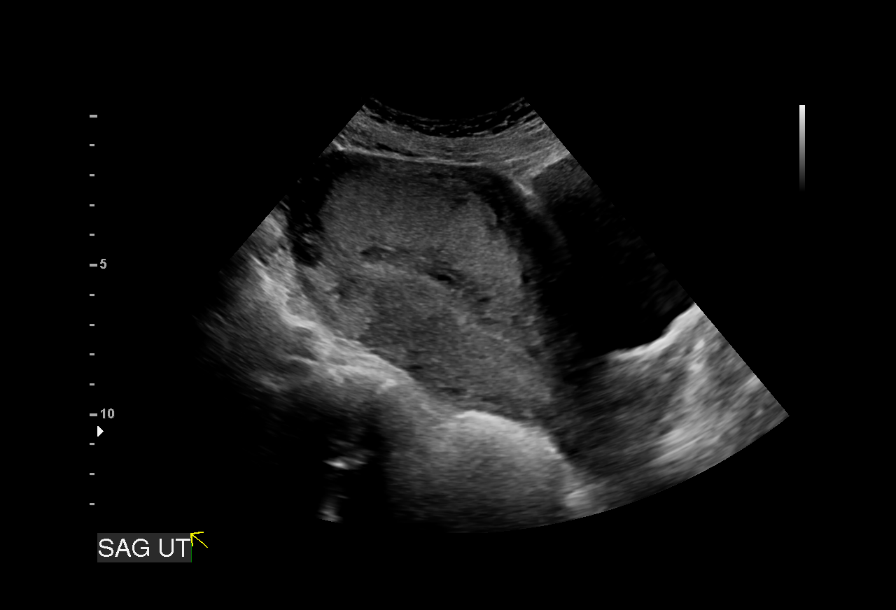
[im 10/11]
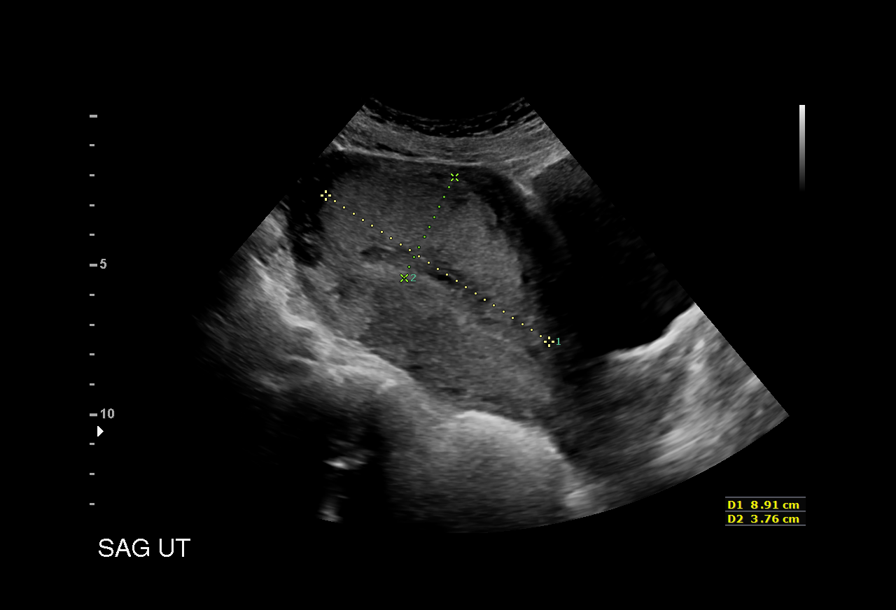
[im 11/11]
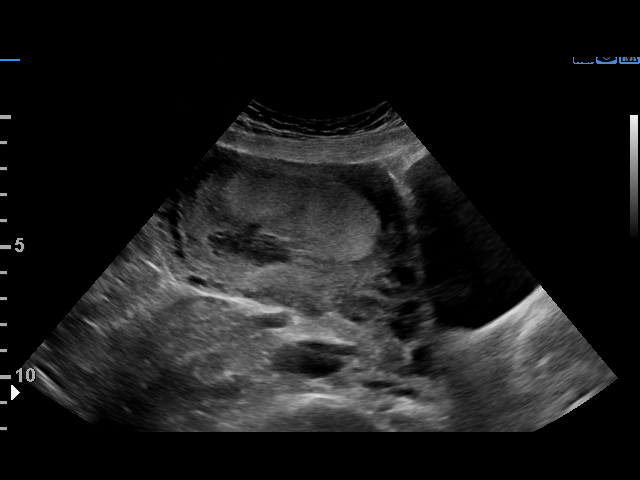

[11 of 11 positions shown; findings below may reference images not displayed]

FINDINGS: Uterus measures 15.3 x 7.3 x 11.6 cm. Retained products of
conception identified, including hypervascular soft tissue
echogenicity at on the order of 8.9 x 3.8 by 7.7 cm.

No significant free fluid.
IMPRESSION: Retained products of conception, as detailed above.
